# Patient Record
Sex: Female | Born: 1950 | Race: White | Hispanic: No | Smoking: Never smoker
Health system: Southern US, Community
[De-identification: ages and names within clinical notes are randomized; demographics above are authoritative.]

## PROBLEM LIST (undated history)

## (undated) DIAGNOSIS — M329 Systemic lupus erythematosus, unspecified: Secondary | ICD-10-CM

## (undated) DIAGNOSIS — IMO0002 Reserved for concepts with insufficient information to code with codable children: Secondary | ICD-10-CM

## (undated) DIAGNOSIS — M797 Fibromyalgia: Secondary | ICD-10-CM

## (undated) DIAGNOSIS — I10 Essential (primary) hypertension: Secondary | ICD-10-CM

## (undated) HISTORY — PX: COLON SURGERY: SHX602

## (undated) HISTORY — PX: APPENDECTOMY: SHX54

---

## 2018-02-02 ENCOUNTER — Emergency Department
Admission: EM | Admit: 2018-02-02 | Discharge: 2018-02-02 | Disposition: A | Discharge status: Home / Self Care | Payer: Medicare Other | Attending: Emergency Medicine | Admitting: Emergency Medicine

## 2018-02-02 ENCOUNTER — Emergency Department: Payer: Medicare Other

## 2018-02-02 ENCOUNTER — Other Ambulatory Visit: Payer: Self-pay

## 2018-02-02 DIAGNOSIS — R110 Nausea: Secondary | ICD-10-CM | POA: Diagnosis not present

## 2018-02-02 DIAGNOSIS — R109 Unspecified abdominal pain: Secondary | ICD-10-CM | POA: Diagnosis not present

## 2018-02-02 DIAGNOSIS — R1010 Upper abdominal pain, unspecified: Secondary | ICD-10-CM

## 2018-02-02 HISTORY — DX: Systemic lupus erythematosus, unspecified: M32.9

## 2018-02-02 HISTORY — DX: Fibromyalgia: M79.7

## 2018-02-02 HISTORY — DX: Reserved for concepts with insufficient information to code with codable children: IMO0002

## 2018-02-02 LAB — COMPREHENSIVE METABOLIC PANEL
ALBUMIN: 4 g/dL (ref 3.5–5.0)
ALT: 24 U/L (ref 14–54)
AST: 27 U/L (ref 15–41)
Alkaline Phosphatase: 78 U/L (ref 38–126)
Anion gap: 10 (ref 5–15)
BILIRUBIN TOTAL: 0.5 mg/dL (ref 0.3–1.2)
BUN: 14 mg/dL (ref 6–20)
CHLORIDE: 99 mmol/L — AB (ref 101–111)
CO2: 30 mmol/L (ref 22–32)
Calcium: 9.7 mg/dL (ref 8.9–10.3)
Creatinine, Ser: 0.78 mg/dL (ref 0.44–1.00)
GFR calc Af Amer: >60 mL/min (ref >60)
GFR calc non Af Amer: >60 mL/min (ref >60)
Glucose, Bld: 147 mg/dL — ABNORMAL HIGH (ref 65–99)
POTASSIUM: 3.5 mmol/L (ref 3.5–5.1)
Sodium: 139 mmol/L (ref 135–145)
Total Protein: 7.4 g/dL (ref 6.5–8.1)

## 2018-02-02 LAB — URINALYSIS, COMPLETE (UACMP) WITH MICROSCOPIC
BILIRUBIN URINE: NEGATIVE
Glucose, UA: NEGATIVE mg/dL
Hgb urine dipstick: NEGATIVE
Ketones, ur: NEGATIVE mg/dL
Nitrite: NEGATIVE
Protein, ur: NEGATIVE mg/dL
SPECIFIC GRAVITY, URINE: 1.019 (ref 1.005–1.030)
Squamous Epithelial / LPF: NONE SEEN
pH: 5 (ref 5.0–8.0)

## 2018-02-02 LAB — CBC
HEMATOCRIT: 41.8 % (ref 35.0–47.0)
Hemoglobin: 14 g/dL (ref 12.0–16.0)
MCH: 30.7 pg (ref 26.0–34.0)
MCHC: 33.4 g/dL (ref 32.0–36.0)
MCV: 91.7 fL (ref 80.0–100.0)
Platelets: 257 10*3/uL (ref 150–440)
RBC: 4.56 MIL/uL (ref 3.80–5.20)
RDW: 13.9 % (ref 11.5–14.5)
WBC: 9 10*3/uL (ref 3.6–11.0)

## 2018-02-02 LAB — TROPONIN I

## 2018-02-02 LAB — LIPASE, BLOOD: Lipase: 31 U/L (ref 11–51)

## 2018-02-02 MED ORDER — ONDANSETRON 4 MG PO TBDP: ORAL_TABLET | ORAL | 0 refills | Status: AC

## 2018-02-02 NOTE — ED Notes (Signed)
abd pain started at 2300 on 2/14. Has now "eased up" and pain is 3.5/10. No diarrhea or vomiting

## 2018-02-02 NOTE — ED Triage Notes (Signed)
Pt arrives to ED via [facility] from home with c/o generalized abdominal pain with radiation into her back. Pt reports pain started 2 hrs ago. Pt denies any c/o N/V/D, no reports of fever, no SHOB. Pt is A&O, in NAD; RR even, regular, and unlabored.

## 2018-02-02 NOTE — ED Provider Notes (Signed)
[facility] Emergency Department Provider Note  ____________________________________________   First MD Initiated Contact with Patient 02/02/18 [phone]     (approximate)  I have reviewed the triage vital signs and the nursing notes.   HISTORY  Chief Complaint Abdominal Pain    HPI Michelle Terrell is a 67 y.o. female with medical history as listed below who presents for evaluation of acute onset about 11:00pm of upper abdominal pain that radiates through to the back.  She she has been feeling normal today but a few hours after eating dinner and when she was going to bed she started to feel the pain.  It is a combination of sharp and aching pain that radiates through to the back.  It started to move up higher into her chest which is what caused her concern.  Nothing in particular made it better or worse that it was actually worse on the ambulance in route to the hospital but it has since gotten better and is currently mild although was severe earlier.  She denies fever/chills, chest pain, vomiting, and diarrhea.  She has had some nausea associated with the pain.  She has had multiple abdominal surgeries in the past including a cholecystectomy and several bowel surgeries.  She has not had any recent dysuria or difficulty with urination.  She denies numbness and tingling and weakness in any of her Extremities.  She is currently in no acute distress and reports that she feels just a mild ache.  she does suffer from acid reflux and wonders if it could just be a much worse acid reflux than she is used to.    Past Medical History:  Diagnosis Date  . Fibromyalgia   . Lupus     There are no active problems to display for this patient.   Past Surgical History:  Procedure Laterality Date  . APPENDECTOMY    . CESAREAN SECTION      Prior to Admission medications   Medication Sig Start Date End Date Taking? Authorizing Provider  ondansetron (ZOFRAN ODT) 4 MG disintegrating  tablet Allow 1-2 tablets to dissolve in your mouth every 8 hours as needed for nausea/vomiting 02/02/18   Loleta Rose, MD    Allergies Patient has no known allergies.  No family history on file.  Social History Social History   Tobacco Use  . Smoking status: Never Smoker  . Smokeless tobacco: Never Used  Substance Use Topics  . Alcohol use: Not on file  . Drug use: Not on file    Review of Systems Constitutional: No fever/chills Eyes: No visual changes. ENT: No sore throat. Cardiovascular: Denies chest pain. Respiratory: Denies shortness of breath. Gastrointestinal: Abd pain and nausea as described above. No vomiting, no bowel symptoms. Genitourinary: Negative for dysuria. Musculoskeletal: Negative for neck pain.  Negative for back pain. Integumentary: Negative for rash. Neurological: Negative for headaches, focal weakness or numbness.  ____________________________________________   PHYSICAL EXAM:  VITAL SIGNS: ED Triage Vitals  Enc Vitals Group     BP 02/02/18 0158 (!) 171/80     Pulse Rate 02/02/18 0158 81     Resp 02/02/18 0158 17     Temp 02/02/18 0158 98.5 F (36.9 C)     Temp Source 02/02/18 0158 Oral     SpO2 02/02/18 0158 100 %     Weight 02/02/18 0159 77.1 kg (170 lb)     Height 02/02/18 0159 1.626 m (5\' 4" )     Head Circumference --  Peak Flow --      Pain Score 02/02/18 0158 5     Pain Loc --      Pain Edu? --      Excl. in GC? --     Constitutional: Alert and oriented. Well appearing and in no acute distress. Eyes: Conjunctivae are normal.  Head: Atraumatic. Nose: No congestion/rhinnorhea. Mouth/Throat: Mucous membranes are moist. Neck: No stridor.  No meningeal signs.   Cardiovascular: Normal rate, regular rhythm. Good peripheral circulation. Grossly normal heart sounds. Respiratory: Normal respiratory effort.  No retractions. Lungs CTAB. Gastrointestinal: Soft with very mild epigastric tenderness to palpation, no rebound, no  guarding Musculoskeletal: No lower extremity tenderness nor edema. No gross deformities of extremities. Neurologic:  Normal speech and language. No gross focal neurologic deficits are appreciated.  Skin:  Skin is warm, dry and intact. No rash noted. Psychiatric: Mood and affect are normal. Speech and behavior are normal.  ____________________________________________   LABS (all labs ordered are listed, but only abnormal results are displayed)  Labs Reviewed  COMPREHENSIVE METABOLIC PANEL - Abnormal; Notable for the following components:      Result Value   Chloride 99 (*)    Glucose, Bld 147 (*)    All other components within normal limits  URINALYSIS, COMPLETE (UACMP) WITH MICROSCOPIC - Abnormal; Notable for the following components:   Color, Urine YELLOW (*)    APPearance CLEAR (*)    Leukocytes, UA TRACE (*)    Bacteria, UA RARE (*)    All other components within normal limits  LIPASE, BLOOD  CBC  TROPONIN I  TROPONIN I   ____________________________________________  EKG  ED ECG REPORT I, Loleta Roseory Wakeelah Solan, the attending physician, personally viewed and interpreted this ECG.  Date: 02/02/2018 EKG Time: 02:01 Rate: 81 Rhythm: normal sinus rhythm QRS Axis: normal Intervals: normal w/ borderline LVH ST/T Wave abnormalities: Non-specific ST segment / T-wave changes, but no evidence of acute ischemia. (Inverted T waves in lead III) Narrative Interpretation: no evidence of acute ischemia  ____________________________________________  RADIOLOGY   ED MD interpretation:  Scattered air-fluid levels without dilatation  Official radiology report(s): Dg Abdomen Acute W/chest  Result Date: 02/02/2018 CLINICAL DATA:  Abdominal pain EXAM: DG ABDOMEN ACUTE W/ 1V CHEST COMPARISON:  None. FINDINGS: PA chest: Lungs are clear. Heart size and pulmonary vascularity are normal. No adenopathy. There is aortic atherosclerosis. Supine and upright abdomen: There is moderate stool in the  colon. There is no appreciable bowel dilatation. There are scattered air-fluid levels throughout the abdomen. No free air. There are surgical clips in gallbladder fossa region. There are phleboliths in the pelvis. IMPRESSION: Scattered air-fluid levels without bowel dilatation. Suspect enteritis or early ileus. Bowel obstruction felt to be less likely. No free air. Lungs are clear.  There is aortic atherosclerosis. Aortic Atherosclerosis (ICD10-I70.0). Electronically Signed   By: Bretta BangWilliam  Woodruff III M.D.   On: 02/02/2018 07:04    ____________________________________________   PROCEDURES  Critical Care performed: No   Procedure(s) performed:   Procedures   ____________________________________________   INITIAL IMPRESSION / ASSESSMENT AND PLAN / ED COURSE  As part of my medical decision making, I reviewed the following data within the electronic MEDICAL RECORD NUMBER Nursing notes reviewed and incorporated, Labs reviewed , EKG interpreted  and Radiograph reviewed     Differential diagnosis includes, but is not limited to, biliary disease (biliary colic, acute cholecystitis, cholangitis, choledocholithiasis, etc), intrathoracic causes for epigastric abdominal pain including ACS, gastritis, duodenitis, pancreatitis, small bowel or large bowel  obstruction, abdominal aortic aneurysm, hernia, and gastritis.  Based on the fact the symptoms have almost completely gone away, the general description, and her demographics, I think that an emergent issue such as aortic disease is very unlikely.  Wells score for PE is 0.  She has had some nausea but no vomiting and no diarrhea.  Partial bowel obstruction or ileus is possible but if so it seems to have resolved.  I will check troponins to make sure this is not an atypical presentation of ACS but her EKG is normal and I have a low suspicion for ACS and she is low risk based on HEART score.  CMP is within normal limits, no evidence of infection on urinalysis,  lipase is normal, and CBC is normal with no leukocytosis.     ____________________________________________  FINAL CLINICAL IMPRESSION(S) / ED DIAGNOSES  Final diagnoses:  Pain of upper abdomen  Nausea     MEDICATIONS GIVEN DURING THIS VISIT:  Medications - No data to display   ED Discharge Orders        Ordered    ondansetron (ZOFRAN ODT) 4 MG disintegrating tablet     02/02/18 0732       Note:  This document was prepared using Dragon voice recognition software and may include unintentional dictation errors.    Loleta Rose, MD 02/02/18 [phone]

## 2018-02-02 NOTE — ED Notes (Signed)
E-sing pad not working at this time. Discharge instructions and RX reviewed with patient. Pt verbalized understanding and denies any questions or concerns at this time.

## 2018-02-02 NOTE — Discharge Instructions (Signed)

## 2018-03-12 ENCOUNTER — Encounter: Payer: Self-pay | Admitting: *Deleted

## 2018-03-12 ENCOUNTER — Other Ambulatory Visit: Payer: Self-pay

## 2018-03-12 ENCOUNTER — Ambulatory Visit
Admission: EM | Admit: 2018-03-12 | Discharge: 2018-03-12 | Disposition: A | Discharge status: Home / Self Care | Payer: Medicare Other | Attending: Family Medicine | Admitting: Family Medicine

## 2018-03-12 DIAGNOSIS — I10 Essential (primary) hypertension: Secondary | ICD-10-CM

## 2018-03-12 DIAGNOSIS — T783XXA Angioneurotic edema, initial encounter: Secondary | ICD-10-CM | POA: Diagnosis not present

## 2018-03-12 HISTORY — DX: Essential (primary) hypertension: I10

## 2018-03-12 MED ORDER — METHYLPREDNISOLONE SODIUM SUCC 125 MG IJ SOLR
125.0000 mg | Freq: Once | INTRAMUSCULAR | Status: AC
  Administered 2018-03-12: 125 mg via INTRAMUSCULAR

## 2018-03-12 MED ORDER — FAMOTIDINE 20 MG PO TABS
20.0000 mg | ORAL_TABLET | Freq: Once | ORAL | Status: AC
  Administered 2018-03-12: 20 mg via ORAL

## 2018-03-12 MED ORDER — AMLODIPINE BESYLATE 5 MG PO TABS: 5.0000 mg | ORAL_TABLET | Freq: Every day | ORAL | 1 refills | Status: AC

## 2018-03-12 MED ORDER — DIPHENHYDRAMINE HCL 50 MG/ML IJ SOLN
25.0000 mg | Freq: Once | INTRAMUSCULAR | Status: AC
  Administered 2018-03-12: 25 mg via INTRAMUSCULAR

## 2018-03-12 MED ORDER — HYDROCHLOROTHIAZIDE 25 MG PO TABS: 12.5000 mg | ORAL_TABLET | Freq: Every day | ORAL | 1 refills | Status: AC

## 2018-03-12 NOTE — ED Triage Notes (Addendum)
Patient started having symptom of unexplained tongue swelling 1 hour before arrival to urgent care. Patient does not have a history of oral swelling anaphylaxis or allergies to foods or medications.

## 2018-03-12 NOTE — ED Provider Notes (Signed)
[facility]    CSN: [id number] Arrival date & time: 03/12/18  1026     History   Chief Complaint Chief Complaint  Patient presents with  . Oral Swelling    HPI Michelle Terrell is a 67 y.o. female.   Patient is a 67 year old female who presents with complaint of swelling to the left side of her tongue and may be the roof of her mouth.  Patient states she was talking the phone with her mother when she noticed her tongue started to swell.  Patient denies any new foods, drinks, medications, etc.  Patient denies any shortness of breath, numbness, or tingling.  Patient is on lisinopril daily which the patient states she takes at night.  She states that she has been on this medication for at least 10 years.     Past Medical History:  Diagnosis Date  . Fibromyalgia   . Hypertension   . Lupus     There are no active problems to display for this patient.   Past Surgical History:  Procedure Laterality Date  . APPENDECTOMY    . CESAREAN SECTION    . COLON SURGERY      OB History   None      Home Medications    Prior to Admission medications   Medication Sig Start Date End Date Taking? Authorizing Provider  aspirin EC 81 MG tablet Take by mouth. 02/13/18 02/13/19 Yes [provider]  atorvastatin (LIPITOR) 20 MG tablet  02/13/18  Yes [provider]  pantoprazole (PROTONIX) 40 MG tablet  02/06/18  Yes [provider]  tiZANidine (ZANAFLEX) 2 MG tablet  01/31/18  Yes [provider]  traMADol (ULTRAM) 50 MG tablet  02/06/18  Yes [provider]  amLODipine (NORVASC) 5 MG tablet Take 1 tablet (5 mg total) by mouth daily. 03/12/18   Candis SchatzHarris, Antonique Langford D, PA-C  hydrochlorothiazide (HYDRODIURIL) 25 MG tablet Take 0.5 tablets (12.5 mg total) by mouth daily. 03/12/18   Candis SchatzHarris, Xyler Terpening D, PA-C  ondansetron (ZOFRAN ODT) 4 MG disintegrating tablet Allow 1-2 tablets to dissolve in your mouth every 8 hours as needed for nausea/vomiting  02/02/18   Loleta RoseForbach, Cory, MD    Family History Family History  Problem Relation Age of Onset  . Heart failure Mother     Social History Social History   Tobacco Use  . Smoking status: Never Smoker  . Smokeless tobacco: Never Used  Substance Use Topics  . Alcohol use: Not Currently  . Drug use: Never     Allergies   Lisinopril   Review of Systems Review of Systems  As noted above in HPI.  Other systems reviewed and found to be negative  Physical Exam Triage Vital Signs ED Triage Vitals  Enc Vitals Group     BP 03/12/18 1033 (!) 181/77     Pulse Rate 03/12/18 1033 82     Resp 03/12/18 1033 16     Temp 03/12/18 1033 98 F (36.7 C)     Temp Source 03/12/18 1033 Oral     SpO2 03/12/18 1033 99 %     Weight 03/12/18 1049 172 lb (78 kg)     Height 03/12/18 1049 5\' 4"  (1.626 m)     Head Circumference --      Peak Flow --      Pain Score 03/12/18 1049 0     Pain Loc --      Pain Edu? --      Excl. in  GC? --    No data found.  Updated Vital Signs BP (!) 181/77 (BP Location: Right Arm)   Pulse 82   Temp 98 F (36.7 C) (Oral)   Resp 16   Ht 5\' 4"  (1.626 m)   Wt 172 lb (78 kg)   SpO2 99%   BMI 29.52 kg/m    Physical Exam  Constitutional: She is oriented to person, place, and time. She appears well-developed and well-nourished. No distress.  HENT:  Mouth/Throat: Uvula is midline.   swelling noted to the left side of the tongue compared to the right.  No throat swelling noted.  No stridor or sonorous respirations noted.  Patient denies shortness of breath.  Eyes: Pupils are equal, round, and reactive to light. EOM are normal.  Neck: Normal range of motion. Neck supple.  Cardiovascular: Normal rate, regular rhythm and normal heart sounds.  No murmur heard. Pulmonary/Chest: Effort normal and breath sounds normal.  Abdominal: Soft. Bowel sounds are normal.  Musculoskeletal: Normal range of motion.  Neurological: She is alert and oriented to person, place, and  time.  Skin: Skin is warm and dry.  Psychiatric: She has a normal mood and affect. Her behavior is normal.     UC Treatments / Results  Labs (all labs ordered are listed, but only abnormal results are displayed) Labs Reviewed - No data to display  EKG None Radiology No results found.  Procedures Procedures (including critical care time)  Medications Ordered in UC Medications  diphenhydrAMINE (BENADRYL) injection 25 mg (25 mg Intramuscular Given 03/12/18 1047)  famotidine (PEPCID) tablet 20 mg (20 mg Oral Given 03/12/18 1047)  methylPREDNISolone sodium succinate (SOLU-MEDROL) 125 mg/2 mL injection 125 mg (125 mg Intramuscular Given 03/12/18 1057)     Initial Impression / Assessment and Plan / UC Course  I have reviewed the triage vital signs and the nursing notes.  Pertinent labs & imaging results that were available during my care of the patient were reviewed by me and considered in my medical decision making (see chart for details).      Patient tongue swelling began abruptly this morning. Patient denies any new foods, drinks, new medications, or mouthwash/toothpaste etc.  Patient however is on lisinopril for her high blood pressure.  concern is for angioedema secondary to her lisinopril use.We will go ahead and treat her with IM injections of Benadryl 25 mg and Solu-Medrol 125 mg as well as a p.o. Pepcid.  We will watch her here in the clinic to make sure the swelling improves.  If it improves, we should be able to send her home if not she may need to go the ER for further management.  Final Clinical Impressions(s) / UC Diagnoses   Final diagnoses:  Angioedema, initial encounter   Tongue swelling improved. Pt continues to have no airway compromise and no SOB or increased WOB. Pt is stable for discharge home. She reports an appointment with her PCP on April 5.  I personally spoke with Dr. Ether Griffins on the phone and discussed patient's events of today.  We will have patient  follow-up tomorrow with Dr. Ether Griffins at 1:20 PM.   ED Discharge Orders        Ordered    hydrochlorothiazide (HYDRODIURIL) 25 MG tablet  Daily     03/12/18 1102    amLODipine (NORVASC) 5 MG tablet  Daily     03/12/18 1102       Controlled Substance Prescriptions [place] Controlled Substance Registry consulted? Not Applicable  Candis Schatz, PA-C 03/12/18 1154

## 2018-03-12 NOTE — Discharge Instructions (Addendum)
-  STOP lisinopril -will continue the HCTZ as a separate medication. One tablet daily -will add amlodipine for blood pressure control for now until you are able to see your PCP. One tablet daily. -can take a Benadryl at home tonight -go to ER should swelling re-occur  -Follow up with Dr. Ether GriffinsFowler tomorrow at 1:20 pm

## 2019-05-20 IMAGING — CR DG ABDOMEN ACUTE W/ 1V CHEST
4 series · 4 of 4 positions shown · non-contrast
Comparison: None.

CLINICAL DATA: Abdominal pain

EXAM:
DG ABDOMEN ACUTE W/ 1V CHEST

[chest pa]
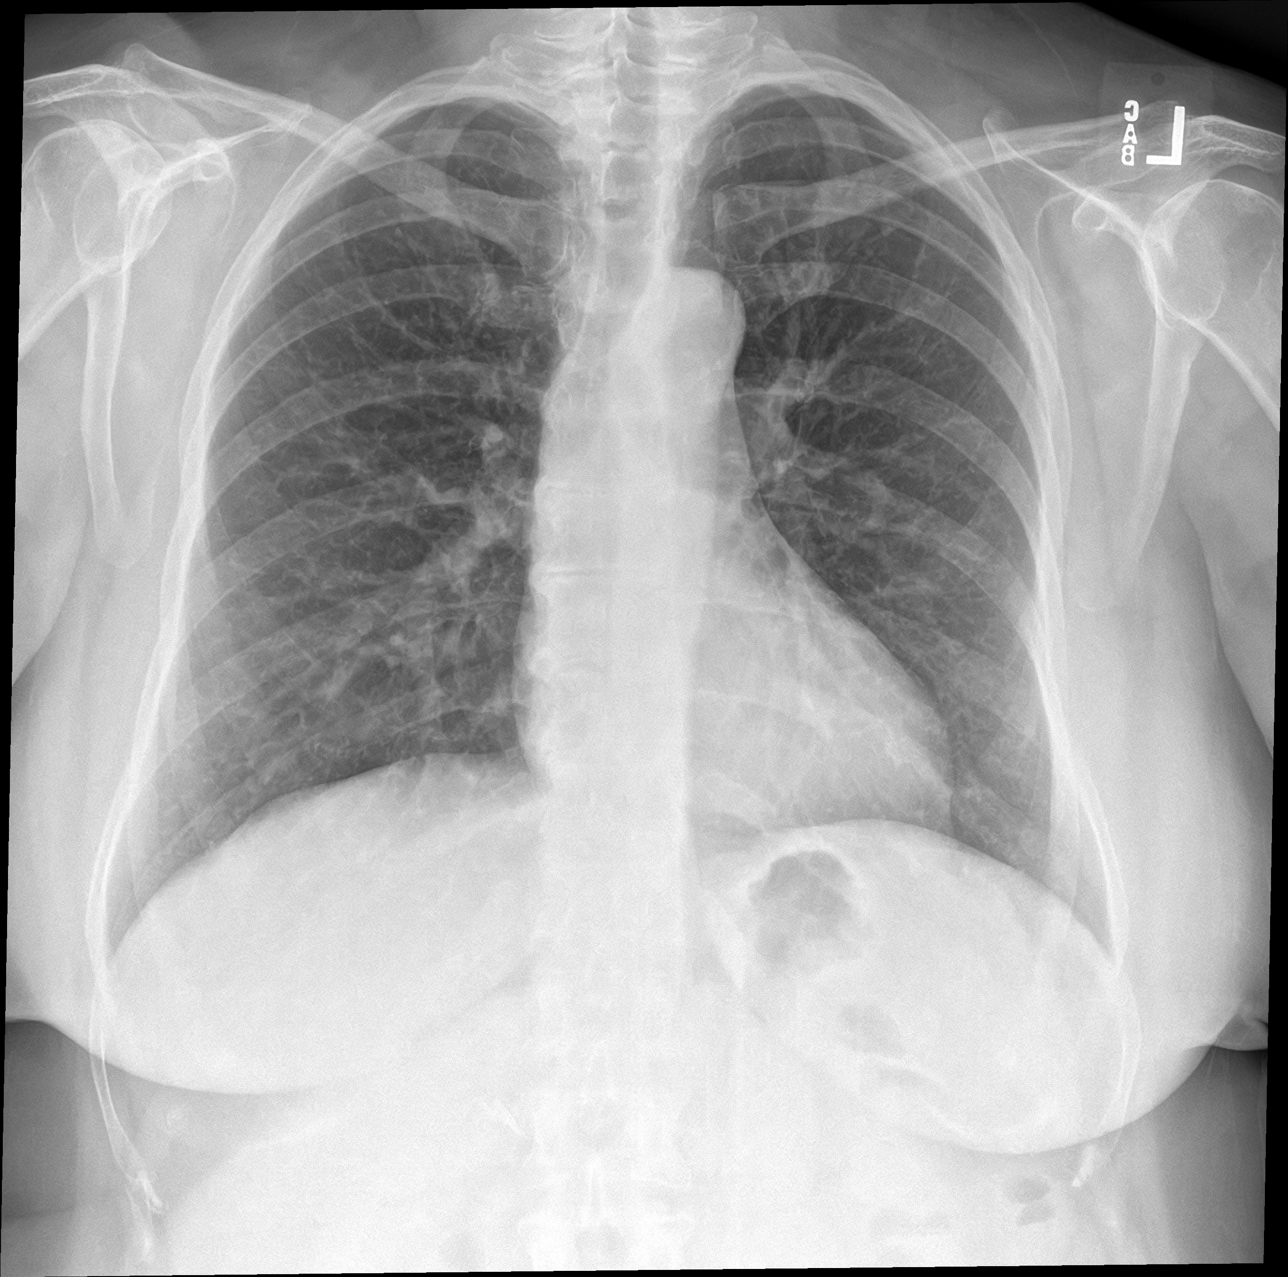

[abdomen erect]
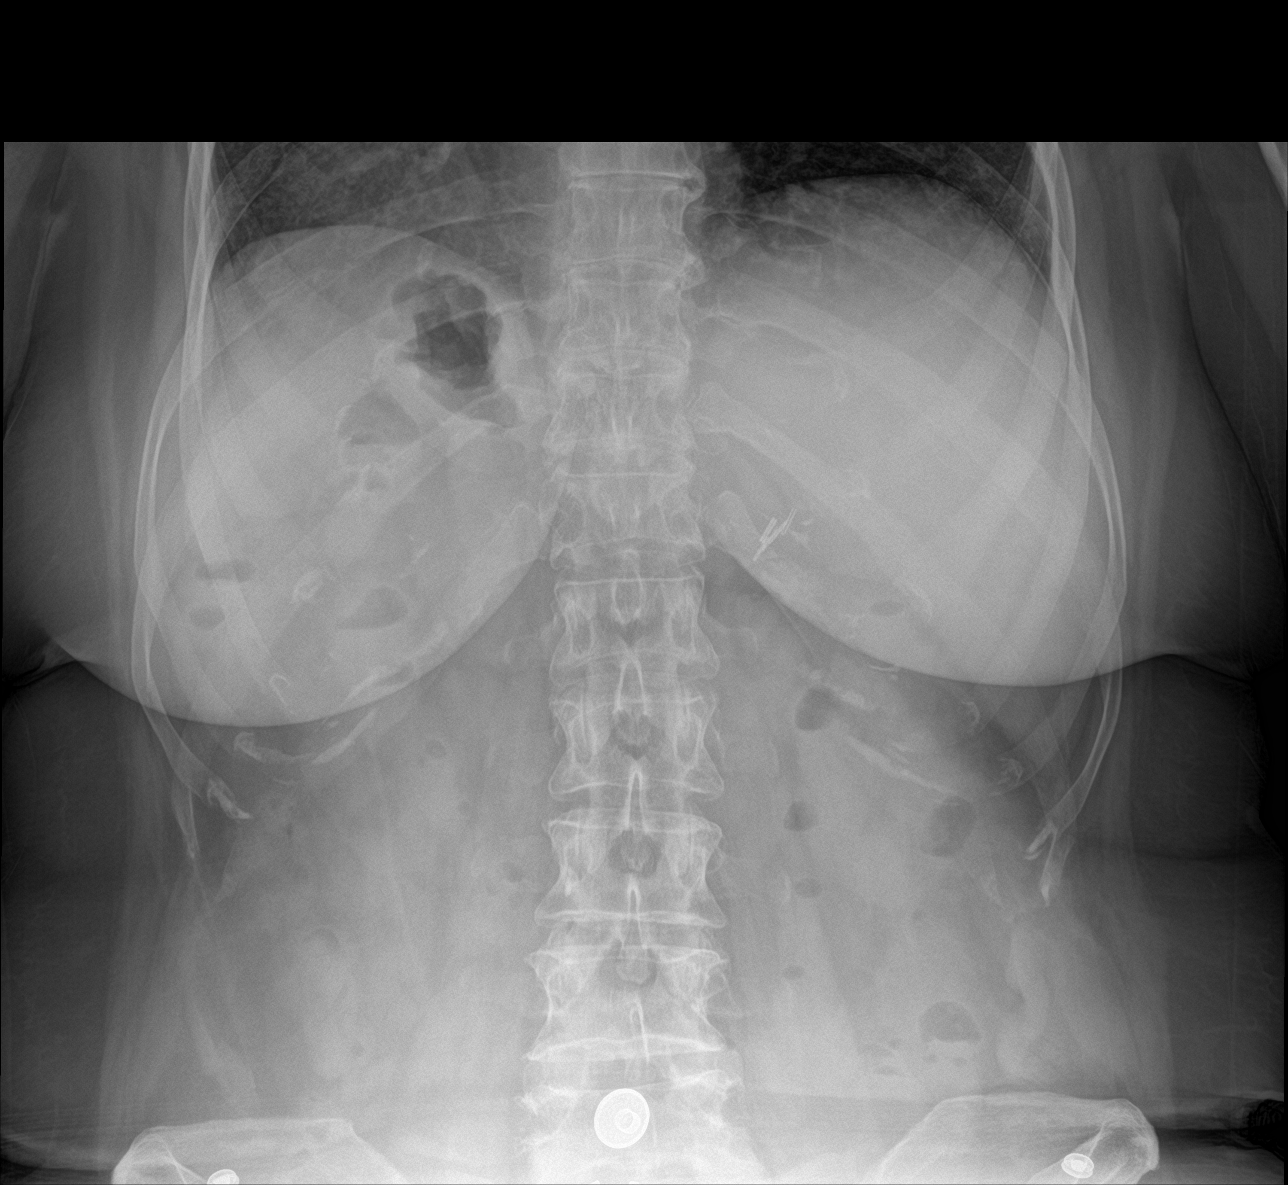

[abdomen supine (1 of 2)]
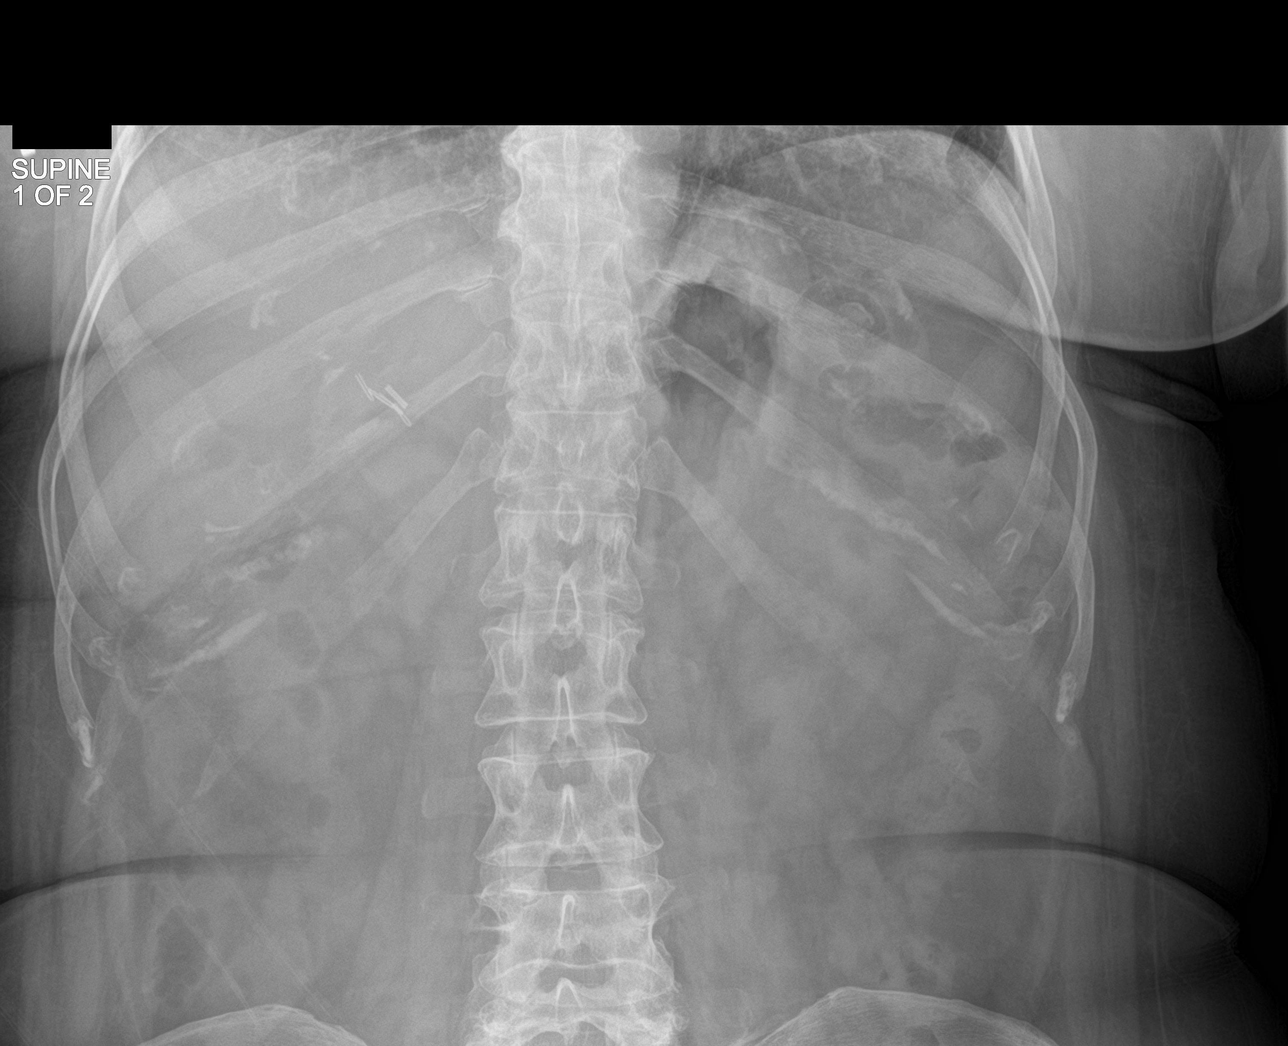

[abdomen supine (2 of 2)]
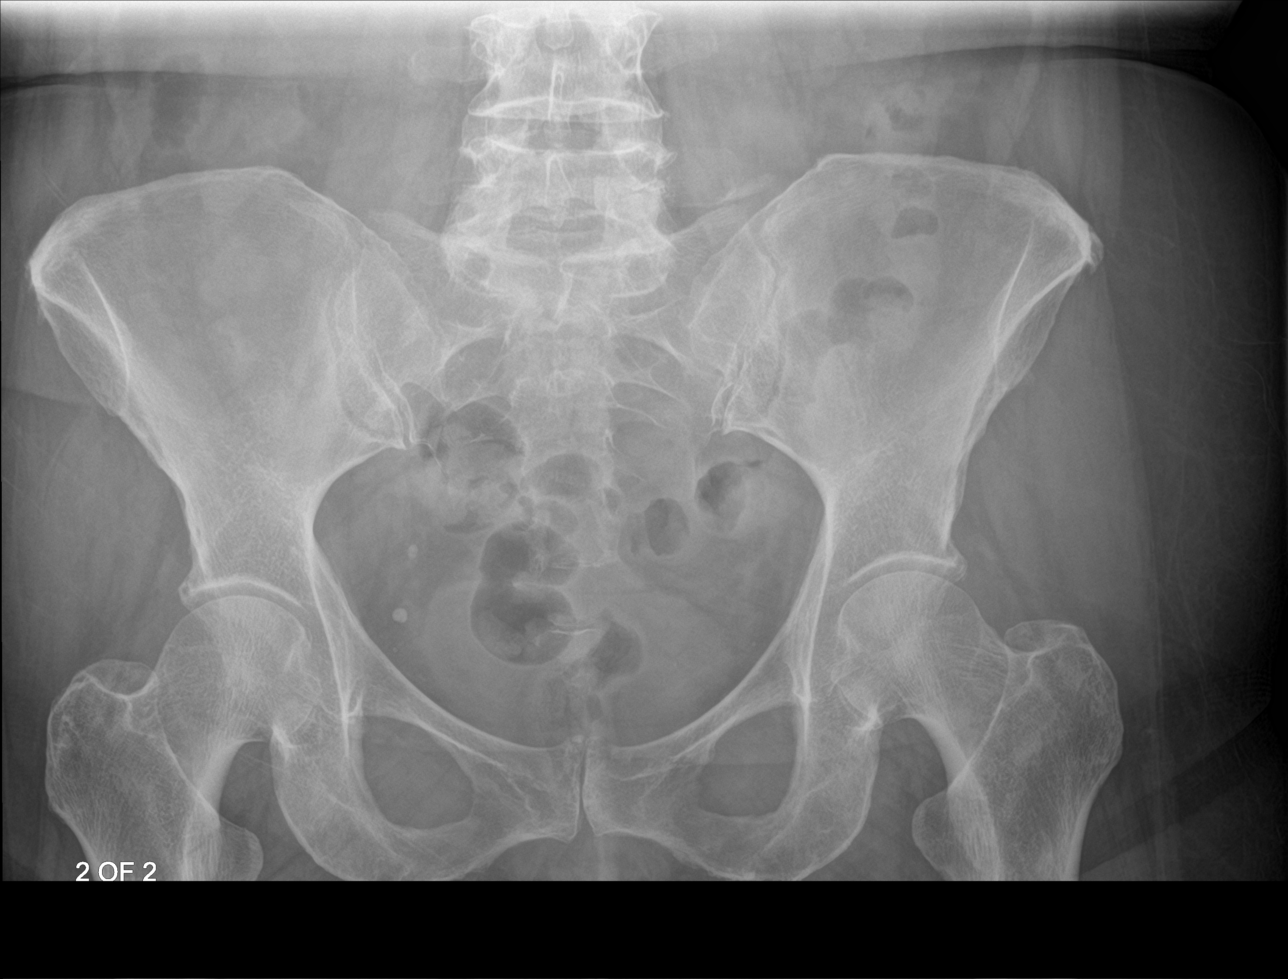

[4 of 4 positions shown; findings below may reference images not displayed]

FINDINGS: PA chest: Lungs are clear. Heart size and pulmonary vascularity are
normal. No adenopathy. There is aortic atherosclerosis.

Supine and upright abdomen: There is moderate stool in the colon.
There is no appreciable bowel dilatation. There are scattered
air-fluid levels throughout the abdomen. No free air. There are
surgical clips in gallbladder fossa region. There are phleboliths in
the pelvis.
IMPRESSION: Scattered air-fluid levels without bowel dilatation. Suspect
enteritis or early ileus. Bowel obstruction felt to be less likely.
No free air.

Lungs are clear.  There is aortic atherosclerosis.

Aortic Atherosclerosis ([id number]).
